# Patient Record
Sex: Female | Born: 2000 | Race: Black or African American | Hispanic: No | Marital: Single | Smoking: Never smoker
Health system: Southern US, Community
[De-identification: ages and names within clinical notes are randomized; demographics above are authoritative.]

## PROBLEM LIST (undated history)

## (undated) DIAGNOSIS — K5900 Constipation, unspecified: Secondary | ICD-10-CM

---
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
## 2022-09-14 ENCOUNTER — Encounter (HOSPITAL_BASED_OUTPATIENT_CLINIC_OR_DEPARTMENT_OTHER): Payer: Self-pay | Admitting: Emergency Medicine

## 2022-09-14 ENCOUNTER — Other Ambulatory Visit: Payer: Self-pay

## 2022-09-14 ENCOUNTER — Emergency Department (HOSPITAL_BASED_OUTPATIENT_CLINIC_OR_DEPARTMENT_OTHER)
Admission: EM | Admit: 2022-09-14 | Discharge: 2022-09-14 | Disposition: A | Discharge status: Home / Self Care | Payer: Medicaid Other | Attending: Emergency Medicine | Admitting: Emergency Medicine

## 2022-09-14 DIAGNOSIS — R1084 Generalized abdominal pain: Secondary | ICD-10-CM | POA: Insufficient documentation

## 2022-09-14 DIAGNOSIS — R110 Nausea: Secondary | ICD-10-CM | POA: Insufficient documentation

## 2022-09-14 DIAGNOSIS — R197 Diarrhea, unspecified: Secondary | ICD-10-CM | POA: Insufficient documentation

## 2022-09-14 DIAGNOSIS — Z20822 Contact with and (suspected) exposure to covid-19: Secondary | ICD-10-CM | POA: Insufficient documentation

## 2022-09-14 LAB — COMPREHENSIVE METABOLIC PANEL
ALT: 11 U/L (ref 0–44)
AST: 24 U/L (ref 15–41)
Albumin: 4.3 g/dL (ref 3.5–5.0)
Alkaline Phosphatase: 68 U/L (ref 38–126)
Anion gap: 11 (ref 5–15)
BUN: 9 mg/dL (ref 6–20)
CO2: 17 mmol/L — ABNORMAL LOW (ref 22–32)
Calcium: 8.7 mg/dL — ABNORMAL LOW (ref 8.9–10.3)
Chloride: 109 mmol/L (ref 98–111)
Creatinine, Ser: 0.76 mg/dL (ref 0.44–1.00)
GFR, Estimated: >60 mL/min (ref >60)
Glucose, Bld: 91 mg/dL (ref 70–99)
Potassium: 3.2 mmol/L — ABNORMAL LOW (ref 3.5–5.1)
Sodium: 137 mmol/L (ref 135–145)
Total Bilirubin: 0.7 mg/dL (ref 0.3–1.2)
Total Protein: 7.8 g/dL (ref 6.5–8.1)

## 2022-09-14 LAB — CBC WITH DIFFERENTIAL/PLATELET
Abs Immature Granulocytes: 0.01 10*3/uL (ref 0.00–0.07)
Basophils Absolute: 0 10*3/uL (ref 0.0–0.1)
Basophils Relative: 1 %
Eosinophils Absolute: 0.1 10*3/uL (ref 0.0–0.5)
Eosinophils Relative: 2 %
HCT: 36.9 % (ref 36.0–46.0)
Hemoglobin: 11.7 g/dL — ABNORMAL LOW (ref 12.0–15.0)
Immature Granulocytes: 0 %
Lymphocytes Relative: 23 %
Lymphs Abs: 0.8 10*3/uL (ref 0.7–4.0)
MCH: 24 pg — ABNORMAL LOW (ref 26.0–34.0)
MCHC: 31.7 g/dL (ref 30.0–36.0)
MCV: 75.6 fL — ABNORMAL LOW (ref 80.0–100.0)
Monocytes Absolute: 0.6 10*3/uL (ref 0.1–1.0)
Monocytes Relative: 18 %
Neutro Abs: 2 10*3/uL (ref 1.7–7.7)
Neutrophils Relative %: 56 %
Platelets: 240 10*3/uL (ref 150–400)
RBC: 4.88 MIL/uL (ref 3.87–5.11)
RDW: 16.3 % — ABNORMAL HIGH (ref 11.5–15.5)
WBC: 3.5 10*3/uL — ABNORMAL LOW (ref 4.0–10.5)
nRBC: 0 % (ref 0.0–0.2)

## 2022-09-14 LAB — RESP PANEL BY RT-PCR (FLU A&B, COVID) ARPGX2
Influenza A by PCR: NEGATIVE
Influenza B by PCR: NEGATIVE
SARS Coronavirus 2 by RT PCR: NEGATIVE

## 2022-09-14 LAB — LIPASE, BLOOD: Lipase: 21 U/L (ref 11–51)

## 2022-09-14 MED ORDER — ONDANSETRON HCL 4 MG/2ML IJ SOLN
4.0000 mg | Freq: Once | INTRAMUSCULAR | Status: AC
  Administered 2022-09-14: 4 mg via INTRAVENOUS
  Filled 2022-09-14: qty 2

## 2022-09-14 MED ORDER — DROPERIDOL 2.5 MG/ML IJ SOLN
1.2500 mg | Freq: Once | INTRAMUSCULAR | Status: AC
  Administered 2022-09-14: 1.25 mg via INTRAVENOUS
  Filled 2022-09-14: qty 2

## 2022-09-14 MED ORDER — DIPHENHYDRAMINE HCL 50 MG/ML IJ SOLN
25.0000 mg | Freq: Once | INTRAMUSCULAR | Status: DC
  Filled 2022-09-14: qty 1

## 2022-09-14 MED ORDER — LACTATED RINGERS IV BOLUS
1000.0000 mL | Freq: Once | INTRAVENOUS | Status: AC
  Administered 2022-09-14: 1000 mL via INTRAVENOUS

## 2022-09-14 NOTE — ED Provider Notes (Signed)
[facility] EMERGENCY DEPARTMENT Provider Note   CSN: [id number] Arrival date & time: 09/14/22  1347     History  Chief Complaint  Patient presents with   Abdominal Pain   Nausea   Headache    Ashley Castro is a 21 y.o. female.  21 year old female presents today for evaluation of abdominal pain ongoing since October 7.  Recently evaluated at another emergency room on October 9 with reassuring work-up.  However she states she did not have imaging at that time and her symptoms persist.  She states she had 1 episode of emesis on October 7 and no additional episodes.  She states she has had some diarrhea since yesterday.  She denies fever, chills.  Does endorse marijuana use most recently this morning.  Denies dysuria, urinary frequency, pelvic pain, flank pain, vaginal discharge, or recent change in sexual partner.  She states she is currently on her menstrual cycle.  The history is provided by the patient and medical records. No language interpreter was used.       Home Medications Prior to Admission medications   Medication Sig Start Date End Date Taking? Authorizing Provider  DENTA 5000 PLUS 1.1 % CREA dental cream BRUSH EVERY OTHER NIGHT AND NOTHING TO EAT OR DRINK FOR 30 MINUTES 04/14/19   [provider]  desogestrel-ethinyl estradiol (APRI) 0.15-30 MG-MCG tablet Take by mouth. 07/23/19   [provider]  hydrocortisone 2.5 % lotion Apply topically 2 (two) times daily. Patient not taking: Reported on 05/07/2019 04/02/18   Aviva Kluver B, PA-C  polyethylene glycol [facility]) packet Take 17 g by mouth daily. Patient not taking: Reported on 08/12/2019 12/20/18   Petrucelli, Pleas Koch, PA-C  SM CLEARLAX powder  12/20/18   [provider]  triamcinolone cream (KENALOG) 0.1 % Apply 1 application topically 3 (three) times daily as needed. Patient not taking: Reported on 08/12/2019 07/26/15   Pricilla Loveless, MD  Vitamin D, Ergocalciferol, (DRISDOL)  1.25 MG (50000 UT) CAPS capsule TAKE 1 CAP BY MOUTH WEEKLY FOR 12 WEEKS 04/22/19   [provider]      Allergies    Patient has no known allergies.    Review of Systems   Review of Systems  Constitutional:  Negative for chills and fever.  Gastrointestinal:  Positive for abdominal pain. Negative for nausea and vomiting.  Genitourinary:  Positive for vaginal bleeding (menstrual bleeding). Negative for dysuria, flank pain, pelvic pain, vaginal discharge and vaginal pain.  Neurological:  Negative for light-headedness.  All other systems reviewed and are negative.   Physical Exam Updated Vital Signs BP 112/89 (BP Location: Left Arm)   Pulse 85   Temp 97.7 F (36.5 C) (Oral)   Resp 18   Ht 5\' 2"  (1.575 m)   Wt 61.2 kg   LMP 09/10/2022 (Exact Date)   SpO2 99%   BMI 24.69 kg/m  Physical Exam Vitals and nursing note reviewed.  Constitutional:      General: She is not in acute distress.    Appearance: Normal appearance. She is well-developed. She is not ill-appearing.  HENT:     Head: Normocephalic and atraumatic.     Nose: Nose normal.  Eyes:     Conjunctiva/sclera: Conjunctivae normal.  Cardiovascular:     Rate and Rhythm: Normal rate and regular rhythm.     Pulses: Normal pulses.  Pulmonary:     Effort: Pulmonary effort is normal. No respiratory distress.     Breath sounds: Normal breath sounds. No wheezing  or rales.  Abdominal:     General: There is no distension.     Palpations: Abdomen is soft.     Tenderness: There is no abdominal tenderness. There is no right CVA tenderness, left CVA tenderness or guarding.  Musculoskeletal:        General: No deformity. Normal range of motion.     Cervical back: Normal range of motion.     Right lower leg: No edema.     Left lower leg: No edema.  Skin:    Findings: No rash.  Neurological:     Mental Status: She is alert.     ED Results / Procedures / Treatments   Labs (all labs ordered are listed, but only  abnormal results are displayed) Labs Reviewed  COMPREHENSIVE METABOLIC PANEL - Abnormal; Notable for the following components:      Result Value   Potassium 3.2 (*)    CO2 17 (*)    Calcium 8.7 (*)    All other components within normal limits  CBC WITH DIFFERENTIAL/PLATELET - Abnormal; Notable for the following components:   WBC 3.5 (*)    Hemoglobin 11.7 (*)    MCV 75.6 (*)    MCH 24.0 (*)    RDW 16.3 (*)    All other components within normal limits  LIPASE, BLOOD  URINALYSIS, ROUTINE W REFLEX MICROSCOPIC  PREGNANCY, URINE    EKG None  Radiology No results found.  Procedures Procedures    Medications Ordered in ED Medications  lactated ringers bolus 1,000 mL (has no administration in time range)  ondansetron (ZOFRAN) injection 4 mg (has no administration in time range)  droperidol (INAPSINE) 2.5 MG/ML injection 1.25 mg (has no administration in time range)    ED Course/ Medical Decision Making/ A&P                           Medical Decision Making Amount and/or Complexity of Data Reviewed Labs: ordered. Radiology: ordered.  Risk Prescription drug management.   Medical Decision Making / ED Course   This patient presents to the ED for concern of abdominal pain, this involves an extensive number of treatment options, and is a complaint that carries with it a high risk of complications and morbidity.  The differential diagnosis includes gastroenteritis, cholecystitis, appendicitis, PID, cannabinoid hyperemesis syndrome, UTI, pyelonephritis  MDM: 21 year old female presents today for evaluation of abdominal pain which she states has been ongoing for years however this particular episode has been ongoing since October 7.  Has been evaluated at outside emergency room with reassuring work-up.  She is concerned because she did not have imaging done at that time and she is worried it may be something concerning.  Low suspicion for PID given no recent change in sexual  partner, without vaginal discharge, or pelvic pain.  Her abdominal pain is generalized.  Low suspicion of UTI pyelonephritis as she is without flank pain, dysuria, urinary frequency.  Will provide symptomatic management, and obtain CT abdomen pelvis.  Will provide IV fluids.  Mild hypokalemia noted on blood work with potassium of 3.2.  CBC otherwise shows mild leukopenia at 3.5, hemoglobin of 11.7 otherwise without acute abnormalities.  Lipase within normal limits.  CMP outside of the mild hypokalemia unremarkable.  Overall reassuring work-up.  Abdomen is benign.  Low suspicion for appendicitis as there is no tenderness palpation, and she is afebrile.  Likely cannabinoid hyperemesis syndrome.  Patient on reevaluation became agitated and requested  to leave.  She reports improvement in her abdominal pain.  Her grandma is on the phone on FaceTime call as well.  Patient is alert and oriented x4.  Question if she became agitated following droperidol.  Offered Benadryl and reassessment patient initially agreeable however then changed her mind and requested to leave.  Patient is able to make this decision.  Patient does not want potassium supplement either.  Patient discharged in stable condition.  Return precautions discussed.  Patient voices understanding and is in agreement with plan.   Additional history obtained: -Additional history obtained from ER visit at [facility] -External records from outside source obtained and reviewed including: Chart review including previous notes, labs, imaging, consultation notes   Lab Tests: -I ordered, reviewed, and interpreted labs.   The pertinent results include:   Labs Reviewed  COMPREHENSIVE METABOLIC PANEL - Abnormal; Notable for the following components:      Result Value   Potassium 3.2 (*)    CO2 17 (*)    Calcium 8.7 (*)    All other components within normal limits  CBC WITH DIFFERENTIAL/PLATELET - Abnormal; Notable for the following components:   WBC 3.5 (*)     Hemoglobin 11.7 (*)    MCV 75.6 (*)    MCH 24.0 (*)    RDW 16.3 (*)    All other components within normal limits  RESP PANEL BY RT-PCR (FLU A&B, COVID) ARPGX2  LIPASE, BLOOD  URINALYSIS, ROUTINE W REFLEX MICROSCOPIC  PREGNANCY, URINE      EKG  EKG Interpretation  Date/Time:    Ventricular Rate:    PR Interval:    QRS Duration:   QT Interval:    QTC Calculation:   R Axis:     Text Interpretation:           Imaging Studies ordered: I ordered imaging studies including CT abdomen pelvis ordered but patient did want this prior to discharge.  I independently visualized and interpreted imaging. I agree with the radiologist interpretation   Medicines ordered and prescription drug management: Meds ordered this encounter  Medications   lactated ringers bolus 1,000 mL   ondansetron (ZOFRAN) injection 4 mg   droperidol (INAPSINE) 2.5 MG/ML injection 1.25 mg    -I have reviewed the patients home medicines and have made adjustments as needed  Reevaluation: After the interventions noted above, I reevaluated the patient and found that they have :improved  Co morbidities that complicate the patient evaluation  Past Medical History:  Diagnosis Date   Constipation       Dispostion: Patient discharged in stable condition. Return precautions discussed.   Final Clinical Impression(s) / ED Diagnoses Final diagnoses:  Generalized abdominal pain    Rx / DC Orders ED Discharge Orders     None         Marita [place], PA-C 09/14/22 1707    Gwyneth Sprout, MD 09/16/22 1520

## 2022-09-14 NOTE — ED Notes (Signed)
Called to room by patient. Reports she is tired, uncomfortable and ready to go home. Provider notified

## 2022-09-14 NOTE — Discharge Instructions (Addendum)
Your work-up today was overall reassuring however he decided to leave prior to having your CT scan done.  Your potassium was slightly low at 3.2 you did not want any medications or supplements prior to discharge.  He reports your abdominal pain was better prior to discharge.  For any concerning symptoms please return to the emergency room.

## 2022-09-14 NOTE — ED Notes (Signed)
Reviewed discharge instructions and recommendations with patient. Pt states understanding. Pt ambulatory for discharge. Pt being picked by grand mother

## 2022-09-14 NOTE — ED Triage Notes (Signed)
Generalized abdominal pain, nausea and intermittent headache that restarted on 10/7. States this has been an ongoing issues x 7 - 8 years. Seen at [facility] on 10/9 for same and told nothing was wrong. Daily marijuana and vape use.

## 2023-06-21 ENCOUNTER — Encounter (HOSPITAL_BASED_OUTPATIENT_CLINIC_OR_DEPARTMENT_OTHER): Payer: Self-pay | Admitting: Emergency Medicine

## 2023-06-21 ENCOUNTER — Emergency Department (HOSPITAL_BASED_OUTPATIENT_CLINIC_OR_DEPARTMENT_OTHER): Payer: Medicaid Other

## 2023-06-21 ENCOUNTER — Observation Stay (HOSPITAL_BASED_OUTPATIENT_CLINIC_OR_DEPARTMENT_OTHER)
Admission: EM | Admit: 2023-06-21 | Discharge: 2023-06-22 | Disposition: A | Discharge status: Home / Self Care | Payer: Medicaid Other | Attending: Internal Medicine | Admitting: Internal Medicine

## 2023-06-21 ENCOUNTER — Other Ambulatory Visit: Payer: Self-pay

## 2023-06-21 DIAGNOSIS — E876 Hypokalemia: Secondary | ICD-10-CM | POA: Insufficient documentation

## 2023-06-21 DIAGNOSIS — D509 Iron deficiency anemia, unspecified: Secondary | ICD-10-CM | POA: Diagnosis not present

## 2023-06-21 DIAGNOSIS — R112 Nausea with vomiting, unspecified: Principal | ICD-10-CM | POA: Insufficient documentation

## 2023-06-21 DIAGNOSIS — E8729 Other acidosis: Secondary | ICD-10-CM | POA: Diagnosis not present

## 2023-06-21 DIAGNOSIS — Z79899 Other long term (current) drug therapy: Secondary | ICD-10-CM | POA: Diagnosis not present

## 2023-06-21 LAB — CBC
HCT: 37.7 % (ref 36.0–46.0)
Hemoglobin: 12 g/dL (ref 12.0–15.0)
MCH: 23.8 pg — ABNORMAL LOW (ref 26.0–34.0)
MCHC: 31.8 g/dL (ref 30.0–36.0)
MCV: 74.7 fL — ABNORMAL LOW (ref 80.0–100.0)
Platelets: 231 10*3/uL (ref 150–400)
RBC: 5.05 MIL/uL (ref 3.87–5.11)
RDW: 16.2 % — ABNORMAL HIGH (ref 11.5–15.5)
WBC: 4.9 10*3/uL (ref 4.0–10.5)
nRBC: 0 % (ref 0.0–0.2)

## 2023-06-21 LAB — RAPID URINE DRUG SCREEN, HOSP PERFORMED
Amphetamines: NOT DETECTED
Barbiturates: NOT DETECTED
Benzodiazepines: NOT DETECTED
Cocaine: NOT DETECTED
Opiates: NOT DETECTED
Tetrahydrocannabinol: POSITIVE — AB

## 2023-06-21 LAB — URINALYSIS, ROUTINE W REFLEX MICROSCOPIC
Bilirubin Urine: NEGATIVE
Glucose, UA: NEGATIVE mg/dL
Ketones, ur: >=80 mg/dL — AB
Leukocytes,Ua: NEGATIVE
Nitrite: NEGATIVE
Protein, ur: NEGATIVE mg/dL
Specific Gravity, Urine: 1.02 (ref 1.005–1.030)
pH: 8.5 — ABNORMAL HIGH (ref 5.0–8.0)

## 2023-06-21 LAB — MAGNESIUM: Magnesium: 1.9 mg/dL (ref 1.7–2.4)

## 2023-06-21 LAB — URINALYSIS, MICROSCOPIC (REFLEX): Squamous Epithelial / HPF: NONE SEEN /HPF (ref 0–5)

## 2023-06-21 LAB — COMPREHENSIVE METABOLIC PANEL
ALT: 13 U/L (ref 0–44)
AST: 24 U/L (ref 15–41)
Albumin: 4.5 g/dL (ref 3.5–5.0)
Alkaline Phosphatase: 64 U/L (ref 38–126)
Anion gap: 12 (ref 5–15)
BUN: 12 mg/dL (ref 6–20)
CO2: 18 mmol/L — ABNORMAL LOW (ref 22–32)
Calcium: 8.9 mg/dL (ref 8.9–10.3)
Chloride: 106 mmol/L (ref 98–111)
Creatinine, Ser: 0.83 mg/dL (ref 0.44–1.00)
GFR, Estimated: >60 mL/min (ref >60)
Glucose, Bld: 117 mg/dL — ABNORMAL HIGH (ref 70–99)
Potassium: 2.6 mmol/L — CL (ref 3.5–5.1)
Sodium: 136 mmol/L (ref 135–145)
Total Bilirubin: 1.1 mg/dL (ref 0.3–1.2)
Total Protein: 7.6 g/dL (ref 6.5–8.1)

## 2023-06-21 LAB — TROPONIN I (HIGH SENSITIVITY): Troponin I (High Sensitivity): <2 ng/L (ref <18)

## 2023-06-21 LAB — BRAIN NATRIURETIC PEPTIDE: B Natriuretic Peptide: 5.7 pg/mL (ref 0.0–100.0)

## 2023-06-21 LAB — PREGNANCY, URINE: Preg Test, Ur: NEGATIVE

## 2023-06-21 LAB — HIV ANTIBODY (ROUTINE TESTING W REFLEX): HIV Screen 4th Generation wRfx: NONREACTIVE

## 2023-06-21 LAB — D-DIMER, QUANTITATIVE: D-Dimer, Quant: 0.27 ug/mL-FEU (ref 0.00–0.50)

## 2023-06-21 LAB — POTASSIUM: Potassium: 3.3 mmol/L — ABNORMAL LOW (ref 3.5–5.1)

## 2023-06-21 LAB — CREATININE, SERUM
Creatinine, Ser: 0.59 mg/dL (ref 0.44–1.00)
GFR, Estimated: >60 mL/min (ref >60)

## 2023-06-21 LAB — LIPASE, BLOOD: Lipase: 26 U/L (ref 11–51)

## 2023-06-21 MED ORDER — POTASSIUM CHLORIDE 10 MEQ/100ML IV SOLN: 10.0000 meq | Freq: Once | INTRAVENOUS | Status: DC

## 2023-06-21 MED ORDER — ALUM & MAG HYDROXIDE-SIMETH 200-200-20 MG/5ML PO SUSP: 30.0000 mL | ORAL | Status: DC | PRN

## 2023-06-21 MED ORDER — LACTATED RINGERS IV BOLUS
1000.0000 mL | Freq: Once | INTRAVENOUS | Status: AC
  Administered 2023-06-21: 1000 mL via INTRAVENOUS

## 2023-06-21 MED ORDER — POTASSIUM CHLORIDE IN NACL 40-0.9 MEQ/L-% IV SOLN
INTRAVENOUS | Status: DC
  Filled 2023-06-21 (×2): qty 1000

## 2023-06-21 MED ORDER — SODIUM CHLORIDE 0.9 % IV SOLN: Freq: Once | INTRAVENOUS | Status: AC

## 2023-06-21 MED ORDER — HALOPERIDOL LACTATE 5 MG/ML IJ SOLN
3.0000 mg | Freq: Once | INTRAMUSCULAR | Status: AC
  Administered 2023-06-21: 3 mg via INTRAVENOUS
  Filled 2023-06-21: qty 1

## 2023-06-21 MED ORDER — PANTOPRAZOLE SODIUM 40 MG IV SOLR
INTRAVENOUS | Status: AC
  Administered 2023-06-21: 40 mg
  Filled 2023-06-21: qty 20

## 2023-06-21 MED ORDER — POTASSIUM CHLORIDE 10 MEQ/100ML IV SOLN
10.0000 meq | INTRAVENOUS | Status: AC
  Administered 2023-06-21 (×3): 10 meq via INTRAVENOUS
  Filled 2023-06-21: qty 100

## 2023-06-21 MED ORDER — ACETAMINOPHEN 325 MG PO TABS: 650.0000 mg | ORAL_TABLET | Freq: Four times a day (QID) | ORAL | Status: DC | PRN

## 2023-06-21 MED ORDER — PANTOPRAZOLE SODIUM 40 MG IV SOLR
40.0000 mg | Freq: Two times a day (BID) | INTRAVENOUS | Status: DC
  Administered 2023-06-21 – 2023-06-22 (×2): 40 mg via INTRAVENOUS
  Filled 2023-06-21 (×2): qty 10

## 2023-06-21 MED ORDER — ONDANSETRON HCL 4 MG/2ML IJ SOLN
4.0000 mg | Freq: Once | INTRAMUSCULAR | Status: AC | PRN
  Administered 2023-06-21: 4 mg via INTRAVENOUS
  Filled 2023-06-21: qty 2

## 2023-06-21 MED ORDER — ALUM & MAG HYDROXIDE-SIMETH 200-200-20 MG/5ML PO SUSP
30.0000 mL | Freq: Once | ORAL | Status: AC
  Administered 2023-06-21: 30 mL via ORAL
  Filled 2023-06-21: qty 30

## 2023-06-21 MED ORDER — ENOXAPARIN SODIUM 40 MG/0.4ML IJ SOSY
40.0000 mg | PREFILLED_SYRINGE | INTRAMUSCULAR | Status: DC
  Administered 2023-06-21: 40 mg via SUBCUTANEOUS
  Filled 2023-06-21: qty 0.4

## 2023-06-21 MED ORDER — ONDANSETRON HCL 4 MG PO TABS: 4.0000 mg | ORAL_TABLET | Freq: Three times a day (TID) | ORAL | 0 refills | Status: DC | PRN

## 2023-06-21 MED ORDER — PANTOPRAZOLE SODIUM 20 MG PO TBEC: 20.0000 mg | DELAYED_RELEASE_TABLET | Freq: Two times a day (BID) | ORAL | 0 refills | Status: DC

## 2023-06-21 MED ORDER — PANTOPRAZOLE 80MG IVPB - SIMPLE MED
80.0000 mg | Freq: Once | INTRAVENOUS | Status: AC
  Administered 2023-06-21: 80 mg via INTRAVENOUS
  Filled 2023-06-21: qty 100

## 2023-06-21 MED ORDER — POTASSIUM CHLORIDE CRYS ER 20 MEQ PO TBCR: 40.0000 meq | EXTENDED_RELEASE_TABLET | Freq: Once | ORAL | 0 refills | Status: DC

## 2023-06-21 MED ORDER — METOCLOPRAMIDE HCL 5 MG/ML IJ SOLN: 10.0000 mg | Freq: Four times a day (QID) | INTRAMUSCULAR | Status: DC | PRN

## 2023-06-21 MED ORDER — ENOXAPARIN SODIUM 40 MG/0.4ML IJ SOSY: 40.0000 mg | PREFILLED_SYRINGE | INTRAMUSCULAR | Status: DC

## 2023-06-21 MED ORDER — POTASSIUM CHLORIDE 10 MEQ/100ML IV SOLN
10.0000 meq | Freq: Once | INTRAVENOUS | Status: AC
  Administered 2023-06-21: 10 meq via INTRAVENOUS
  Filled 2023-06-21: qty 100

## 2023-06-21 MED ORDER — ACETAMINOPHEN 650 MG RE SUPP: 650.0000 mg | Freq: Four times a day (QID) | RECTAL | Status: DC | PRN

## 2023-06-21 MED ORDER — ONDANSETRON HCL 4 MG/2ML IJ SOLN
4.0000 mg | Freq: Four times a day (QID) | INTRAMUSCULAR | Status: DC
  Administered 2023-06-21 – 2023-06-22 (×2): 4 mg via INTRAVENOUS
  Filled 2023-06-21 (×2): qty 2

## 2023-06-21 MED ORDER — MAGNESIUM SULFATE IN D5W 1-5 GM/100ML-% IV SOLN
1.0000 g | Freq: Once | INTRAVENOUS | Status: AC
  Administered 2023-06-21: 1 g via INTRAVENOUS
  Filled 2023-06-21: qty 100

## 2023-06-21 NOTE — Discharge Instructions (Addendum)
For your pain, you can try topical capsaicin cream with a concentration of 0.025 to 0.1% in a thin film over the abdomen.   We recommend cannabis cessation as marijuana is associated with episodes of abdominal pain and vomiting.

## 2023-06-21 NOTE — ED Notes (Signed)
Spoke to [facility] for hospitalist consult .

## 2023-06-21 NOTE — ED Triage Notes (Addendum)
Emesis x 3 days , abd pain and shortness of breath , lethargic . Diarrhea .  Marijuana use daily , she said

## 2023-06-21 NOTE — ED Provider Notes (Signed)
[facility] EMERG[facility]Y DEPART[facility] AT [facility] Provider Note   CSN: [id number] Arrival date & time: 06/21/23  0725     History Chief Complaint  Patient presents with   Emesis     Emesis 2 days of worsening vomiting, Ashley Castro is a 22 y.o. female presenting 2-day history of chest pain.    She describes the pain as dull in nature, intermittent and associated with nausea and vomiting.  She denies fever.  She is having chills and shortness of breath.  She does not smoke cigarettes or drink alcohol. Reports regular marijuana use.  Of note patient was admitted on 07/21?2024 for vomiting and abdominal pain. No organic etiology was found.   Patient's recorded medical, surgical, social, medication list and allergies were reviewed in the Snapshot window as part of the initial history.   Review of Systems   Review of Systems  Gastrointestinal:  Positive for vomiting.    Physical Exam Updated Vital Signs BP (!) 149/95 (BP Location: Right Arm)   Pulse 78   Temp 97.8 F (36.6 C) (Oral)   Resp 19   Wt 54.9 kg   LMP 06/19/2023 (Exact Date)   SpO2 100%   BMI 22.13 kg/m  Physical Exam General: Pleasant, ill-appearing.   CV: RRR. No murmurs, rubs, or gallops. No LE edema Pulmonary: Lungs CTAB. Normal effort. No wheezing or rales. Abdominal: Soft, nontender, nondistended. Normal bowel sounds. Extremities: Palpable pulses. Normal ROM. Skin: Warm and dry. No obvious rash or lesions.   ED Course/ Medical Decision Making/ A&P    Procedures Procedures   Medications Ordered in ED Medications  potassium chloride 10 mEq in 100 mL IVPB (10 mEq Intravenous New Bag/Given 06/21/23 1338)  ondansetron (ZOFRAN) injection 4 mg (4 mg Intravenous Given 06/21/23 0738)  potassium chloride 10 mEq in 100 mL IVPB (0 mEq Intravenous Stopped 06/21/23 1319)  0.9 %  sodium chloride infusion ( Intravenous New Bag/Given 06/21/23 0832)  pantoprazole (PROTONIX) 80 mg /NS 100 mL IVPB (0  mg Intravenous Stopped 06/21/23 0939)  alum & mag hydroxide-simeth (MAALOX/MYLANTA) 200-200-20 MG/5ML suspension 30 mL (30 mLs Oral Given 06/21/23 0857)  lactated ringers bolus 1,000 mL (0 mLs Intravenous Stopped 06/21/23 1002)  pantoprazole (PROTONIX) 40 MG injection (40 mg  Given 06/21/23 0857)  haloperidol lactate (HALDOL) injection 3 mg (3 mg Intravenous Given 06/21/23 1135)  magnesium sulfate IVPB 1 g 100 mL (0 g Intravenous Stopped 06/21/23 1319)    Medical Decision Making:    Ashley Castro is a 22 y.o. female who presented to the ED today with emesis and mid chest pain.  Detailed above.     Patient placed on continuous vitals and telemetry monitoring while in ED which was reviewed periodically.   Complete initial physical exam performed, notably the patient  was stable.  Abdomen with normal bowel sounds.  Heart without murmurs or gallops.   Reviewed and confirmed nursing documentation for past medical history, family history, social history.    Initial Assessment:   Differential diagnosis of chest pain and emesis includes ACS versus gastroparesis versus cyclical vomiting.  Initial Plan:  IV fluids, Zofran Pregnancy test, UA Screening labs including CBC and Metabolic panel to evaluate for infectious or metabolic etiology of disease.  Urinalysis with reflex culture ordered to evaluate for UTI or relevant urologic/nephrologic pathology.  CXR to evaluate for structural/infectious intrathoracic pathology.  EKG to evaluate for cardiac pathology. Objective evaluation as below reviewed with plan for close reassessment  Initial Study Results:  Laboratory  All laboratory results reviewed without evidence of clinically relevant pathology.   Exceptions include: Potassium 2.6  EKG EKG was reviewed independently. Rate, rhythm, axis, intervals all examined and without medically relevant abnormality. ST segments without concerns for elevations.    Radiology  All images reviewed  independently. Agree with radiology report at this time.   DG Chest 2 View  Result Date: 06/21/2023 CLINICAL DATA:  sob EXAM: CHEST - 2 VIEW COMPARISON:  06/07/2020. FINDINGS: Bilateral lung fields are clear. Bilateral costophrenic angles are clear. Normal cardio-mediastinal silhouette. No acute osseous abnormalities. The soft tissues are within normal limits. IMPRESSION: No active cardiopulmonary disease. Electronically Signed   By: Jules Schick M.D.   On: 06/21/2023 07:56      Reassessment and Plan:   Potassium 2.6.  Other lab work unremarkable including negative troponin. Chest x-ray negative for cardiopulmonary disease.  She had CT abdomen and pelvis done on 06/17/2023 which showed no acute abnormalities.   Patient etiologies of vomiting in this patient includes cyclic vomiting vs. cannabinoid hyperemesis syndrome.  Clinical Impression:  1. Nausea and vomiting, unspecified vomiting type      Discharge   Final Clinical Impression(s) / ED Diagnoses Final diagnoses:  Nausea and vomiting, unspecified vomiting type    Rx / DC Orders ED Discharge Orders          Ordered    potassium chloride SA (KLOR-CON M) 20 MEQ tablet   Once        06/21/23 1344              Laretta Bolster, MD 06/21/23 1351    Alvira Monday, MD 06/21/23 2222

## 2023-06-21 NOTE — H&P (Signed)
History and Physical    Ashley Castro ZOX:[id number] DOB: [birth year] DOA: 06/21/2023  PCP: Patient, No Pcp Per   Patient coming from: Home  I have personally briefly reviewed patient's old medical records in [facility]  Chief Complaint: Vomiting  HPI: Ashley Castro is a 22 y.o. female with medical history significant of the past medical history presented with worsening nausea, vomiting and upper abdominal pain, progressively getting worse over the last few days.  Patient was recently placed in observation on 06/17/2023 at [facility] for similar complaints and was discharged after symptoms improved with IV fluids and conservative management.  CT of abdomen and pelvis on 06/17/2023 showed no acute abnormalities.  Patient denies any fever, shortness of breath, diarrhea, dysuria, increased frequency of urination, loss of consciousness, seizures.  Patient reports regular marijuana use.  ED Course: Potassium was 2.6.  She was given IV fluids, antiemetics, IV potassium and IV Protonix.  Patient continued to be symptomatic with hospitalist service was called to evaluate the patient.  Review of Systems: As per HPI otherwise all other systems were reviewed and are negative.   Past Medical History:  Diagnosis Date   Constipation     No past surgical history reported.   reports that she has never smoked. She has been exposed to tobacco smoke. She has never used smokeless tobacco. She reports current alcohol use. She reports current drug use. Drug: Marijuana.  No Known Allergies  Family History  Problem Relation Age of Onset   Asthma Sister     Prior to Admission medications   Medication Sig Start Date End Date Taking? Authorizing Provider  ondansetron (ZOFRAN) 4 MG tablet Take 1 tablet (4 mg total) by mouth every 8 (eight) hours as needed for nausea or vomiting. 06/21/23 07/21/23 Yes Koomson, Lisbeth Ply, MD  pantoprazole (PROTONIX) 20 MG tablet Take 1 tablet (20 mg total) by mouth  2 (two) times daily. 06/21/23  Yes Laretta Bolster, MD  potassium chloride SA (KLOR-CON M) 20 MEQ tablet Take 2 tablets (40 mEq total) by mouth once for 1 dose. 06/21/23 06/21/23 Yes Koomson, Lisbeth Ply, MD  DENTA 5000 PLUS 1.1 % CREA dental cream BRUSH EVERY OTHER NIGHT AND NOTHING TO EAT OR DRINK FOR 30 MINUTES 04/14/19   [provider]  desogestrel-ethinyl estradiol (APRI) 0.15-30 MG-MCG tablet Take by mouth. 07/23/19   [provider]  hydrocortisone 2.5 % lotion Apply topically 2 (two) times daily. Patient not taking: Reported on 05/07/2019 04/02/18   Aviva Kluver B, PA-C  polyethylene glycol [facility]) packet Take 17 g by mouth daily. Patient not taking: Reported on 08/12/2019 12/20/18   Petrucelli, Pleas Koch, PA-C  SM CLEARLAX powder  12/20/18   [provider]  triamcinolone cream (KENALOG) 0.1 % Apply 1 application topically 3 (three) times daily as needed. Patient not taking: Reported on 08/12/2019 07/26/15   Pricilla Loveless, MD  Vitamin D, Ergocalciferol, (DRISDOL) 1.25 MG (50000 UT) CAPS capsule TAKE 1 CAP BY MOUTH WEEKLY FOR 12 WEEKS 04/22/19   [provider]    Physical Exam: Vitals:   06/21/23 1523 06/21/23 1530 06/21/23 1713 06/21/23 1732  BP:  (!) 101/54 122/82 122/82  Pulse:  (!) 56 (!) 54 (!) 54  Resp:  16 14 14   Temp: 99.9 F (37.7 C)  98.6 F (37 C) 98.6 F (37 C)  TempSrc: Oral   Oral  SpO2:  96% 100%   Weight:    56.4 kg  Height:    5\' 1"  (1.549 m)  Constitutional: NAD, calm, comfortable.  On room air. Vitals:   06/21/23 1523 06/21/23 1530 06/21/23 1713 06/21/23 1732  BP:  (!) 101/54 122/82 122/82  Pulse:  (!) 56 (!) 54 (!) 54  Resp:  16 14 14   Temp: 99.9 F (37.7 C)  98.6 F (37 C) 98.6 F (37 C)  TempSrc: Oral   Oral  SpO2:  96% 100%   Weight:    56.4 kg  Height:    5\' 1"  (1.549 m)   Eyes: PERRL, lids and conjunctivae normal ENMT: Mucous membranes are dry.  Posterior pharynx clear of any exudate or lesions. Neck: normal,  supple, no masses, no thyromegaly Respiratory: bilateral decreased breath sounds at bases, no wheezing, no crackles. Normal respiratory effort. No accessory muscle use.  Cardiovascular: S1 S2 positive, mild intermittent bradycardia present.  No extremity edema. 2+ pedal pulses.  Abdomen: no tenderness, no masses palpated. No hepatosplenomegaly. Bowel sounds positive.  Musculoskeletal: no clubbing / cyanosis. No joint deformity upper and lower extremities.  Skin: no rashes, lesions, ulcers. No induration Neurologic: Slow to respond, poor historian.  CN 2-12 grossly intact. Moving extremities. No focal neurologic deficits.  Psychiatric: Flat affect.  Not agitated.   Labs on Admission: I have personally reviewed following labs and imaging studies  CBC: Recent Labs  Lab 06/21/23 0736  WBC 4.9  HGB 12.0  HCT 37.7  MCV 74.7*  PLT 231   Basic Metabolic Panel: Recent Labs  Lab 06/21/23 0736 06/21/23 1546  NA 136  --   K 2.6*  --   CL 106  --   CO2 18*  --   GLUCOSE 117*  --   BUN 12  --   CREATININE 0.83 0.59  CALCIUM 8.9  --   MG 1.9  --    GFR: Estimated Creatinine Clearance: 84.6 mL/min (by C-G formula based on SCr of 0.59 mg/dL). Liver Function Tests: Recent Labs  Lab 06/21/23 0736  AST 24  ALT 13  ALKPHOS 64  BILITOT 1.1  PROT 7.6  ALBUMIN 4.5   Recent Labs  Lab 06/21/23 0736  LIPASE 26   No results for input(s): "AMMONIA" in the last 168 hours. Coagulation Profile: No results for input(s): "INR", "PROTIME" in the last 168 hours. Cardiac Enzymes: No results for input(s): "CKTOTAL", "CKMB", "CKMBINDEX", "TROPONINI" in the last 168 hours. BNP (last 3 results) No results for input(s): "PROBNP" in the last 8760 hours. HbA1C: No results for input(s): "HGBA1C" in the last 72 hours. CBG: No results for input(s): "GLUCAP" in the last 168 hours. Lipid Profile: No results for input(s): "CHOL", "HDL", "LDLCALC", "TRIG", "CHOLHDL", "LDLDIRECT" in the last 72  hours. Thyroid Function Tests: No results for input(s): "TSH", "T4TOTAL", "FREET4", "T3FREE", "THYROIDAB" in the last 72 hours. Anemia Panel: No results for input(s): "VITAMINB12", "FOLATE", "FERRITIN", "TIBC", "IRON", "RETICCTPCT" in the last 72 hours. Urine analysis:    Component Value Date/Time   COLORURINE AMBER (A) 06/21/2023 1011   APPEARANCEUR CLEAR 06/21/2023 1011   LABSPEC 1.020 06/21/2023 1011   PHURINE 8.5 (H) 06/21/2023 1011   GLUCOSEU NEGATIVE 06/21/2023 1011   HGBUR MODERATE (A) 06/21/2023 1011   BILIRUBINUR NEGATIVE 06/21/2023 1011   KETONESUR >=80 (A) 06/21/2023 1011   PROTEINUR NEGATIVE 06/21/2023 1011   NITRITE NEGATIVE 06/21/2023 1011   LEUKOCYTESUR NEGATIVE 06/21/2023 1011    Radiological Exams on Admission: DG Chest 2 View  Result Date: 06/21/2023 CLINICAL DATA:  sob EXAM: CHEST - 2 VIEW COMPARISON:  06/07/2020. FINDINGS: Bilateral lung fields are  clear. Bilateral costophrenic angles are clear. Normal cardio-mediastinal silhouette. No acute osseous abnormalities. The soft tissues are within normal limits. IMPRESSION: No active cardiopulmonary disease. Electronically Signed   By: Jules Schick M.D.   On: 06/21/2023 07:56     Assessment/Plan  Intractable nausea and vomiting -Possibly from gastritis/duodenitis versus cyclical vomiting syndrome from cannabinoid hyperemesis syndrome -Check urine drug screen.  Counseled regarding abstinence from marijuana -IV fluids; IV antiemetics around-the-clock for today along with as needed antiemetics.  IV Protonix and as needed Maalox. -Can try clear liquid diet if patient tolerates  Acute metabolic acidosis -Possibly from above.  Repeat a.m. labs.  IV fluids.  Hypokalemia -Possibly from above. -Patient had replacement in the ED.  Repeat a.m. labs.  Check a.m. magnesium as well.  History of marijuana use -Discussion as above.  DVT prophylaxis: Lovenox Code Status: Full Family Communication: None at  bedside Disposition Plan: Home in 1 to 2 days once clinically improved Consults called: None Admission status: Observation/MedSurg  Severity of Illness: The appropriate patient status for this patient is OBSERVATION. Observation status is judged to be reasonable and necessary in order to provide the required intensity of service to ensure the patient's safety. The patient's presenting symptoms, physical exam findings, and initial radiographic and laboratory data in the context of their medical condition is felt to place them at decreased risk for further clinical deterioration. Furthermore, it is anticipated that the patient will be medically stable for discharge from the hospital within 2 midnights of admission.     Glade Lloyd MD [facility]  06/21/2023, 5:45 PM

## 2023-06-22 DIAGNOSIS — R112 Nausea with vomiting, unspecified: Secondary | ICD-10-CM | POA: Diagnosis not present

## 2023-06-22 LAB — COMPREHENSIVE METABOLIC PANEL: Total Protein: 5.9 g/dL — ABNORMAL LOW (ref 6.5–8.1)

## 2023-06-22 MED ORDER — ONDANSETRON HCL 4 MG PO TABS: 4.0000 mg | ORAL_TABLET | Freq: Three times a day (TID) | ORAL | 0 refills | Status: AC | PRN

## 2023-06-22 MED ORDER — PANTOPRAZOLE SODIUM 40 MG PO TBEC: 40.0000 mg | DELAYED_RELEASE_TABLET | Freq: Two times a day (BID) | ORAL | 0 refills | Status: AC

## 2023-06-22 NOTE — TOC Transition Note (Signed)
Transition of Care [facility]) - CM/SW Discharge Note   Patient Details  Name: Ashley Castro MRN: [id number] Date of Birth: 03/09/2001  Transition of Care [facility] CM/SW Contact:  Otelia Santee, LCSW Phone Number: 06/22/2023, 10:20 AM   Clinical Narrative:    Pt agreeable to having SA resources added to DC instructions. Pt also confirmed she has no insurance and no primary care provider. Pt agreeable to having PCP appointment scheduled. Appt scheduled for 8/6. Information placed on follow up.    Final next level of care: Home/Self Care Barriers to Discharge: No Barriers Identified   Patient Goals and CMS Choice CMS Medicare.gov Compare Post Acute Care list provided to:: Patient Choice offered to / list presented to : Patient  Discharge Placement                         Discharge Plan and Services Additional resources added to the After Visit Summary for                  DME Arranged: N/A DME Agency: NA                  Social Determinants of Health (SDOH) Interventions SDOH Screenings   Food Insecurity: No Food Insecurity (06/22/2023)  Housing: Low Risk  (06/22/2023)  Transportation Needs: No Transportation Needs (06/22/2023)  Utilities: Not At Risk (06/21/2023)  Depression (PHQ2-9): Low Risk  (05/15/2019)  Tobacco Use: Medium Risk (06/21/2023)     Readmission Risk Interventions     No data to display

## 2023-06-22 NOTE — Plan of Care (Signed)
  Problem: Education: Goal: Knowledge of General Education information will improve Description: Including pain rating scale, medication(s)/side effects and non-pharmacologic comfort measures Outcome: Progressing   Problem: Health Behavior/Discharge Planning: Goal: Ability to manage health-related needs will improve Outcome: Progressing   Problem: Clinical Measurements: Goal: Ability to maintain clinical measurements within normal limits will improve Outcome: Progressing Goal: Will remain free from infection Outcome: Progressing   Problem: Activity: Goal: Risk for activity intolerance will decrease Outcome: Progressing   Problem: Nutrition: Goal: Adequate nutrition will be maintained Outcome: Progressing   Problem: Safety: Goal: Ability to remain free from injury will improve Outcome: Progressing   Problem: Skin Integrity: Goal: Risk for impaired skin integrity will decrease Outcome: Progressing

## 2023-06-22 NOTE — Discharge Summary (Signed)
Physician Discharge Summary  Dallana Schmude VHQ:[id number] DOB: 05-May-2001 DOA: 06/21/2023  PCP: Patient, No Pcp Per  Admit date: 06/21/2023 Discharge date: 06/22/2023  Admitted From: Home Disposition: Home  Recommendations for Outpatient Follow-up:  Follow up with PCP in 1 week  Outpatient follow-up with GI if symptoms persist Counseled regarding abstinence from marijuana Follow up in ED if symptoms worsen or new appear   Home Health: No Equipment/Devices: None  Discharge Condition: Stable CODE STATUS: Full Diet recommendation: Soft diet 22 y.o. female with no significant past medical history except for regular marijuana use presented with worsening nausea, vomiting and upper abdominal pain, progressively getting worse over the last few days.  On presentation, potassium was 2.6. She was given IV fluids, antiemetics, IV potassium and IV Protonix.  She continued to remain symptomatic hence was transferred to [facility].  During the hospitalization, her condition has improved.  She was treated with IV fluids and antiemetics.  Diet has been advanced and she is tolerating it.  She wants to go home today.  She was counseled regarding abstinence from marijuana.  She will be discharged home today on oral Protonix along with as needed Zofran.  Brief/Interim Summary:  Intractable nausea and vomiting -Possibly from gastritis/duodenitis versus cyclical vomiting syndrome from cannabinoid hyperemesis syndrome -Urine drug screen positive for tetrahydrocannabinol.  Counseled regarding abstinence from marijuana -Treated with IV fluids, IV antiemetics/Protonix. -During the hospitalization, her condition has improved -Diet has been advanced and she is tolerating it.  She wants to go home today.  She was counseled regarding abstinence from marijuana.  She will be discharged home today on oral Protonix along with as needed Zofran. -Recommend outpatient evaluation and follow-up with GI if  condition does not improve   Acute metabolic acidosis -Possibly from above.  Improving.  Outpatient follow-up.   Hypokalemia -Possibly from above. -Replaced during the hospitalization.  Resolved.  Microcytic anemia -Hemoglobin stable.  Outpatient follow-up.  History of marijuana use -Discussion as above.  Discharge Diagnoses:  Principal Problem:   Intractable nausea and vomiting    Discharge Instructions  Discharge Instructions     Ambulatory referral to Gastroenterology   Complete by: As directed    What is the reason for referral?: Other Comment - emesis   Increase activity slowly   Complete by: As directed       Allergies as of 06/22/2023   No Known Allergies      Medication List     STOP taking these medications    hydrocortisone 2.5 % lotion   polyethylene glycol 17 g packet Commonly known as: MiraLax   SM ClearLax 17 GM/SCOOP powder Generic drug: polyethylene glycol powder   triamcinolone cream 0.1 % Commonly known as: KENALOG       TAKE these medications    Denta 5000 Plus 1.1 % Crea dental cream Generic drug: sodium fluoride BRUSH EVERY OTHER NIGHT AND NOTHING TO EAT OR DRINK FOR 30 MINUTES   desogestrel-ethinyl estradiol 0.15-30 MG-MCG tablet Commonly known as: APRI Take by mouth.   ondansetron 4 MG tablet Commonly known as: ZOFRAN Take 1 tablet (4 mg total) by mouth every 8 (eight) hours as needed for nausea or vomiting.   pantoprazole 40 MG tablet Commonly known as: Protonix Take 1 tablet (40 mg total) by mouth 2 (two) times daily before a meal for 15 days.   Vitamin D (Ergocalciferol) 1.25 MG (50000 UNIT) Caps capsule Commonly known as: DRISDOL TAKE 1 CAP BY MOUTH WEEKLY FOR 12 WEEKS  Follow-up Information     Your primary care physician. Schedule an appointment as soon as possible for a visit .          Willis Modena, MD .   Specialty: Gastroenterology Contact information: [phone] [address] [place] [address] [phone]         [facility] Emergency Department at [facility] .   Specialty: Emergency Medicine Why: If symptoms worsen Contact information: [address] [place] [address] [phone]               No Known Allergies  Consultations: None   Procedures/Studies: DG Chest 2 View  Result Date: 06/21/2023 CLINICAL DATA:  sob EXAM: CHEST - 2 VIEW COMPARISON:  06/07/2020. FINDINGS: Bilateral lung fields are clear. Bilateral costophrenic angles are clear. Normal cardio-mediastinal silhouette. No acute osseous abnormalities. The soft tissues are within normal limits. IMPRESSION: No active cardiopulmonary disease. Electronically Signed   By: Jules Schick M.D.   On: 06/21/2023 07:56      Subjective: Patient seen and examined at bedside.  Feels much better.  Tolerating soft diet today.  Wants to go home today.  Denies worsening abdominal pain.  Discharge Exam: Vitals:   06/22/23 0111 06/22/23 0516  BP: 117/80 (!) 122/93  Pulse: (!) 53 62  Resp: 18 17  Temp: 97.9 F (36.6 C) 97.7 F (36.5 C)  SpO2: 100% 100%    General: Pt is alert, awake, not in acute distress.  On room air. Cardiovascular: rate controlled, S1/S2 + Respiratory: bilateral decreased breath sounds at bases Abdominal: Soft, NT, ND, bowel sounds + Extremities: no edema, no cyanosis    The results of significant diagnostics from this hospitalization (including imaging, microbiology, ancillary and laboratory) are listed below for reference.     Microbiology: No results found for this or any previous visit (from the past 240 hour(s)).   Labs: BNP (last 3 results) Recent Labs    06/21/23 0736  BNP 5.7   Basic Metabolic Panel: Recent Labs  Lab 06/21/23 0736 06/21/23 1546 06/21/23 1839 06/22/23 0518  NA 136  --   --  137  K 2.6*  --  3.3* 4.0  CL 106  --   --  110  CO2 18*  --   --  21*  GLUCOSE 117*  --   --  90  BUN 12   --   --  <5*  CREATININE 0.83 0.59  --  0.74  CALCIUM 8.9  --   --  8.4*  MG 1.9  --   --  2.3   Liver Function Tests: Recent Labs  Lab 06/21/23 0736 06/22/23 0518  AST 24 13*  ALT 13 11  ALKPHOS 64 48  BILITOT 1.1 0.8  PROT 7.6 5.9*  ALBUMIN 4.5 3.2*   Recent Labs  Lab 06/21/23 0736  LIPASE 26   No results for input(s): "AMMONIA" in the last 168 hours. CBC: Recent Labs  Lab 06/21/23 0736 06/22/23 0518  WBC 4.9 5.2  HGB 12.0 9.7*  HCT 37.7 31.7*  MCV 74.7* 79.1*  PLT 231 178   Cardiac Enzymes: No results for input(s): "CKTOTAL", "CKMB", "CKMBINDEX", "TROPONINI" in the last 168 hours. BNP: Invalid input(s): "POCBNP" CBG: No results for input(s): "GLUCAP" in the last 168 hours. D-Dimer Recent Labs    06/21/23 0736  DDIMER 0.27   Hgb A1c No results for input(s): "HGBA1C" in the last 72 hours. Lipid Profile No results for input(s): "  CHOL", "HDL", "LDLCALC", "TRIG", "CHOLHDL", "LDLDIRECT" in the last 72 hours. Thyroid function studies No results for input(s): "TSH", "T4TOTAL", "T3FREE", "THYROIDAB" in the last 72 hours.  Invalid input(s): "FREET3" Anemia work up No results for input(s): "VITAMINB12", "FOLATE", "FERRITIN", "TIBC", "IRON", "RETICCTPCT" in the last 72 hours. Urinalysis    Component Value Date/Time   COLORURINE AMBER (A) 06/21/2023 1011   APPEARANCEUR CLEAR 06/21/2023 1011   LABSPEC 1.020 06/21/2023 1011   PHURINE 8.5 (H) 06/21/2023 1011   GLUCOSEU NEGATIVE 06/21/2023 1011   HGBUR MODERATE (A) 06/21/2023 1011   BILIRUBINUR NEGATIVE 06/21/2023 1011   KETONESUR >=80 (A) 06/21/2023 1011   PROTEINUR NEGATIVE 06/21/2023 1011   NITRITE NEGATIVE 06/21/2023 1011   LEUKOCYTESUR NEGATIVE 06/21/2023 1011   Sepsis Labs Recent Labs  Lab 06/21/23 0736 06/22/23 0518  WBC 4.9 5.2   Microbiology No results found for this or any previous visit (from the past 240 hour(s)).   Time coordinating discharge: 35 minutes  SIGNED:   Glade Lloyd,  MD  [facility] 06/22/2023, 9:49 AM

## 2023-07-03 ENCOUNTER — Telehealth: Payer: Self-pay | Admitting: Physician Assistant

## 2023-07-03 ENCOUNTER — Ambulatory Visit: Payer: Medicaid Other | Admitting: Physician Assistant

## 2023-07-03 NOTE — Progress Notes (Deleted)
New patient visit   Patient: Ashley Castro   DOB: 08/28/2001   21 y.o. Female  MRN: [id number] Visit Date: 07/03/2023  Today's healthcare provider: Alfredia Ferguson, PA-C   Cc. N/v, hospital f/u  Subjective    Ashley Castro is a 22 y.o. female who presents today as a new patient to establish care.  HPI   Pt was admitted to [facility] 7/25-7/26/24 for intractable N/V. History of regular mariuana use, in ED potassium was 2.6, given IV potassium protonix,antiemetics and fluids.  She was advised to dc marijuana, continue of protonix and prn zofran.  Thought to be cannabinoid hyperemesis syndrome.  In ED hbg was 9.7    Past Medical History:  Diagnosis Date   Constipation    No past surgical history on file. Family Status  Relation Name Status   Mother  Alive   Sister  Alive   MGM  Alive   MGF  Alive  No partnership data on file   Family History  Problem Relation Age of Onset   Asthma Sister    Social History   Socioeconomic History   Marital status: Single    Spouse name: Not on file   Number of children: Not on file   Years of education: Not on file   Highest education level: Not on file  Occupational History   Not on file  Tobacco Use   Smoking status: Never    Passive exposure: Yes   Smokeless tobacco: Never   Tobacco comments:    family smokes inside  Vaping Use   Vaping status: Every Day  Substance and Sexual Activity   Alcohol use: Yes   Drug use: Yes    Types: Marijuana    Comment: daily   Sexual activity: Not on file  Other Topics Concern   Not on file  Social History Narrative   Live with Maternal grandma and her sister    She will start 11th grade in the fall at [employer].    She enjoys working out, watching [employer], and doing her make-up.    Social Determinants of Health   Financial Resource Strain: Not on file  Food Insecurity: No Food Insecurity (06/22/2023)   Hunger Vital Sign    Worried  About Running Out of Food in the Last Year: Never true    Ran Out of Food in the Last Year: Never true  Transportation Needs: No Transportation Needs (06/22/2023)   PRAPARE - Administrator, Civil Service (Medical): No    Lack of Transportation (Non-Medical): No  Physical Activity: Not on file  Stress: Not on file  Social Connections: Not on file   Outpatient Medications Prior to Visit  Medication Sig   DENTA 5000 PLUS 1.1 % CREA dental cream BRUSH EVERY OTHER NIGHT AND NOTHING TO EAT OR DRINK FOR 30 MINUTES   desogestrel-ethinyl estradiol (APRI) 0.15-30 MG-MCG tablet Take by mouth.   ondansetron (ZOFRAN) 4 MG tablet Take 1 tablet (4 mg total) by mouth every 8 (eight) hours as needed for nausea or vomiting.   pantoprazole (PROTONIX) 40 MG tablet Take 1 tablet (40 mg total) by mouth 2 (two) times daily before a meal for 15 days.   Vitamin D, Ergocalciferol, (DRISDOL) 1.25 MG (50000 UT) CAPS capsule TAKE 1 CAP BY MOUTH WEEKLY FOR 12 WEEKS   No facility-administered medications prior to visit.   No Known Allergies  Immunization History  Administered Date(s) Administered   PFIZER(Purple  Top)SARS-COV-2 Vaccination 04/09/2020, 04/30/2020    Health Maintenance  Topic Date Due   HPV VACCINES (1 - 3-dose series) Never done   Hepatitis C Screening  Never done   DTaP/Tdap/Td (1 - Tdap) Never done   COVID-19 Vaccine (3 - 2023-24 season) 07/28/2022   INFLUENZA VACCINE  06/28/2023   HIV Screening  Completed    Patient Care Team: Patient, No Pcp Per as PCP - General (General Practice)  Review of Systems    Objective    LMP 06/19/2023 (Exact Date)   Physical Exam ***  Depression Screen    05/15/2019    8:53 AM  PHQ 2/9 Scores  PHQ - 2 Score 0   No results found for any visits on 07/03/23.  Assessment & Plan     ***  No follow-ups on file.     I, Alfredia Ferguson, PA-C have reviewed all documentation for this visit. The documentation on  07/03/23   for the exam,  diagnosis, procedures, and orders are all accurate and complete.    Alfredia Ferguson, PA-C  [facility] at [address] [phone] (phone) [phone] (fax)  [facility]

## 2023-07-03 NOTE — Telephone Encounter (Signed)
Pt called stating that she had accidentally went to the [facility] located at [facility] instead of coming to our office and had missed her NP appt. Pt was looking to reschedule and advised that a message would be sent back to get approval from [place]. Pt acknowledged understanding.

## 2023-07-04 NOTE — Progress Notes (Deleted)
New patient visit   Patient: Ashley Castro   DOB: 13-Jul-2001   21 y.o. Female  MRN: [id number] Visit Date: 07/05/2023  Today's healthcare provider: Alfredia Ferguson, PA-C   No chief complaint on file.  Subjective    Ashley Castro is a 22 y.o. female who presents today as a new patient to establish care.  HPI  ***  Past Medical History:  Diagnosis Date   Constipation    No past surgical history on file. Family Status  Relation Name Status   Mother  Alive   Sister  Alive   MGM  Alive   MGF  Alive  No partnership data on file   Family History  Problem Relation Age of Onset   Asthma Sister    Social History   Socioeconomic History   Marital status: Single    Spouse name: Not on file   Number of children: Not on file   Years of education: Not on file   Highest education level: Not on file  Occupational History   Not on file  Tobacco Use   Smoking status: Never    Passive exposure: Yes   Smokeless tobacco: Never   Tobacco comments:    family smokes inside  Vaping Use   Vaping status: Every Day  Substance and Sexual Activity   Alcohol use: Yes   Drug use: Yes    Types: Marijuana    Comment: daily   Sexual activity: Not on file  Other Topics Concern   Not on file  Social History Narrative   Live with Maternal grandma and her sister    She will start 11th grade in the fall at [employer].    She enjoys working out, watching [employer], and doing her make-up.    Social Determinants of Health   Financial Resource Strain: Not on file  Food Insecurity: No Food Insecurity (06/22/2023)   Hunger Vital Sign    Worried About Running Out of Food in the Last Year: Never true    Ran Out of Food in the Last Year: Never true  Transportation Needs: No Transportation Needs (06/22/2023)   PRAPARE - Administrator, Civil Service (Medical): No    Lack of Transportation (Non-Medical): No  Physical Activity: Not on file   Stress: Not on file  Social Connections: Not on file   Outpatient Medications Prior to Visit  Medication Sig   DENTA 5000 PLUS 1.1 % CREA dental cream BRUSH EVERY OTHER NIGHT AND NOTHING TO EAT OR DRINK FOR 30 MINUTES   desogestrel-ethinyl estradiol (APRI) 0.15-30 MG-MCG tablet Take by mouth.   ondansetron (ZOFRAN) 4 MG tablet Take 1 tablet (4 mg total) by mouth every 8 (eight) hours as needed for nausea or vomiting.   pantoprazole (PROTONIX) 40 MG tablet Take 1 tablet (40 mg total) by mouth 2 (two) times daily before a meal for 15 days.   Vitamin D, Ergocalciferol, (DRISDOL) 1.25 MG (50000 UT) CAPS capsule TAKE 1 CAP BY MOUTH WEEKLY FOR 12 WEEKS   No facility-administered medications prior to visit.   No Known Allergies  Immunization History  Administered Date(s) Administered   PFIZER(Purple Top)SARS-COV-2 Vaccination 04/09/2020, 04/30/2020    Health Maintenance  Topic Date Due   HPV VACCINES (1 - 3-dose series) Never done   Hepatitis C Screening  Never done   DTaP/Tdap/Td (1 - Tdap) Never done   COVID-19 Vaccine (3 - 2023-24 season) 07/28/2022   INFLUENZA VACCINE  06/28/2023   HIV Screening  Completed    Patient Care Team: Patient, No Pcp Per as PCP - General (General Practice)  Review of Systems    Objective    LMP 06/19/2023 (Exact Date)   Physical Exam ***  Depression Screen    05/15/2019    8:53 AM  PHQ 2/9 Scores  PHQ - 2 Score 0   No results found for any visits on 07/05/23.  Assessment & Plan     ***  No follow-ups on file.     {provider attestation***:1}   Alfredia Ferguson, PA-C  [facility] [facility] [phone] (phone) [phone] (fax)  [facility]

## 2023-07-05 ENCOUNTER — Ambulatory Visit: Payer: Medicaid Other | Admitting: Physician Assistant

## 2024-04-03 ENCOUNTER — Emergency Department (HOSPITAL_BASED_OUTPATIENT_CLINIC_OR_DEPARTMENT_OTHER)
Admission: EM | Admit: 2024-04-03 | Discharge: 2024-04-03 | Disposition: A | Discharge status: Home / Self Care | Attending: Emergency Medicine | Admitting: Emergency Medicine

## 2024-04-03 ENCOUNTER — Encounter (HOSPITAL_BASED_OUTPATIENT_CLINIC_OR_DEPARTMENT_OTHER): Payer: Self-pay | Admitting: Emergency Medicine

## 2024-04-03 ENCOUNTER — Other Ambulatory Visit: Payer: Self-pay

## 2024-04-03 DIAGNOSIS — R1013 Epigastric pain: Secondary | ICD-10-CM | POA: Insufficient documentation

## 2024-04-03 DIAGNOSIS — Z79899 Other long term (current) drug therapy: Secondary | ICD-10-CM | POA: Diagnosis not present

## 2024-04-03 DIAGNOSIS — R112 Nausea with vomiting, unspecified: Secondary | ICD-10-CM | POA: Diagnosis not present

## 2024-04-03 DIAGNOSIS — E875 Hyperkalemia: Secondary | ICD-10-CM | POA: Diagnosis not present

## 2024-04-03 DIAGNOSIS — R630 Anorexia: Secondary | ICD-10-CM | POA: Diagnosis not present

## 2024-04-03 DIAGNOSIS — E876 Hypokalemia: Secondary | ICD-10-CM | POA: Insufficient documentation

## 2024-04-03 DIAGNOSIS — R0602 Shortness of breath: Secondary | ICD-10-CM | POA: Insufficient documentation

## 2024-04-03 DIAGNOSIS — F419 Anxiety disorder, unspecified: Secondary | ICD-10-CM | POA: Insufficient documentation

## 2024-04-03 LAB — URINALYSIS, ROUTINE W REFLEX MICROSCOPIC
Bilirubin Urine: NEGATIVE
Glucose, UA: NEGATIVE mg/dL
Ketones, ur: >=80 mg/dL — AB
Leukocytes,Ua: NEGATIVE
Nitrite: NEGATIVE
Protein, ur: NEGATIVE mg/dL
Specific Gravity, Urine: 1.02 (ref 1.005–1.030)
pH: 6 (ref 5.0–8.0)

## 2024-04-03 LAB — URINALYSIS, MICROSCOPIC (REFLEX)

## 2024-04-03 LAB — BASIC METABOLIC PANEL WITH GFR
Anion gap: 13 (ref 5–15)
BUN: 14 mg/dL (ref 6–20)
CO2: 18 mmol/L — ABNORMAL LOW (ref 22–32)
Calcium: 8.1 mg/dL — ABNORMAL LOW (ref 8.9–10.3)
Chloride: 103 mmol/L (ref 98–111)
Creatinine, Ser: 0.72 mg/dL (ref 0.44–1.00)
GFR, Estimated: >60 mL/min (ref >60)
Glucose, Bld: 227 mg/dL — ABNORMAL HIGH (ref 70–99)
Potassium: 3.3 mmol/L — ABNORMAL LOW (ref 3.5–5.1)
Sodium: 133 mmol/L — ABNORMAL LOW (ref 135–145)

## 2024-04-03 LAB — COMPREHENSIVE METABOLIC PANEL WITH GFR
ALT: 9 U/L (ref 0–44)
AST: 18 U/L (ref 15–41)
Albumin: 4.5 g/dL (ref 3.5–5.0)
Alkaline Phosphatase: 64 U/L (ref 38–126)
Anion gap: 19 — ABNORMAL HIGH (ref 5–15)
BUN: 18 mg/dL (ref 6–20)
CO2: 15 mmol/L — ABNORMAL LOW (ref 22–32)
Calcium: 9.2 mg/dL (ref 8.9–10.3)
Chloride: 103 mmol/L (ref 98–111)
Creatinine, Ser: 0.79 mg/dL (ref 0.44–1.00)
GFR, Estimated: >60 mL/min (ref >60)
Glucose, Bld: 100 mg/dL — ABNORMAL HIGH (ref 70–99)
Potassium: 3.2 mmol/L — ABNORMAL LOW (ref 3.5–5.1)
Sodium: 136 mmol/L (ref 135–145)
Total Bilirubin: 1.1 mg/dL (ref 0.0–1.2)
Total Protein: 7.6 g/dL (ref 6.5–8.1)

## 2024-04-03 LAB — PREGNANCY, URINE: Preg Test, Ur: NEGATIVE

## 2024-04-03 LAB — URINE DRUG SCREEN
Amphetamines: NOT DETECTED
Barbiturates: NOT DETECTED
Benzodiazepines: NOT DETECTED
Cocaine: NOT DETECTED
Fentanyl: NOT DETECTED
Methadone Scn, Ur: NOT DETECTED
Opiates: NOT DETECTED
Tetrahydrocannabinol: DETECTED — AB

## 2024-04-03 LAB — HEMOGLOBIN A1C
Hgb A1c MFr Bld: 5 % (ref 4.8–5.6)
Mean Plasma Glucose: 96.8 mg/dL

## 2024-04-03 LAB — CBC
HCT: 35.2 % — ABNORMAL LOW (ref 36.0–46.0)
Hemoglobin: 11.2 g/dL — ABNORMAL LOW (ref 12.0–15.0)
MCH: 23.9 pg — ABNORMAL LOW (ref 26.0–34.0)
MCHC: 31.8 g/dL (ref 30.0–36.0)
MCV: 75.2 fL — ABNORMAL LOW (ref 80.0–100.0)
Platelets: 222 10*3/uL (ref 150–400)
RBC: 4.68 MIL/uL (ref 3.87–5.11)
RDW: 18.1 % — ABNORMAL HIGH (ref 11.5–15.5)
WBC: 6.1 10*3/uL (ref 4.0–10.5)
nRBC: 0 % (ref 0.0–0.2)

## 2024-04-03 LAB — LIPASE, BLOOD: Lipase: 10 U/L — ABNORMAL LOW (ref 11–51)

## 2024-04-03 MED ORDER — DROPERIDOL 2.5 MG/ML IJ SOLN
1.2500 mg | Freq: Once | INTRAMUSCULAR | Status: AC
  Administered 2024-04-03: 1.25 mg via INTRAVENOUS
  Filled 2024-04-03: qty 2

## 2024-04-03 MED ORDER — POTASSIUM CHLORIDE CRYS ER 20 MEQ PO TBCR
40.0000 meq | EXTENDED_RELEASE_TABLET | Freq: Once | ORAL | Status: AC
  Administered 2024-04-03: 40 meq via ORAL
  Filled 2024-04-03: qty 2

## 2024-04-03 MED ORDER — ONDANSETRON HCL 4 MG PO TABS: 4.0000 mg | ORAL_TABLET | Freq: Three times a day (TID) | ORAL | 0 refills | Status: AC | PRN

## 2024-04-03 MED ORDER — DEXTROSE 5 % IV BOLUS
500.0000 mL | Freq: Once | INTRAVENOUS | Status: AC
  Administered 2024-04-03: 500 mL via INTRAVENOUS

## 2024-04-03 MED ORDER — POTASSIUM CHLORIDE CRYS ER 20 MEQ PO TBCR: 20.0000 meq | EXTENDED_RELEASE_TABLET | Freq: Every day | ORAL | 0 refills | Status: AC

## 2024-04-03 MED ORDER — SODIUM CHLORIDE 0.9 % IV BOLUS
1000.0000 mL | Freq: Once | INTRAVENOUS | Status: AC
  Administered 2024-04-03: 1000 mL via INTRAVENOUS

## 2024-04-03 MED ORDER — DIAZEPAM 5 MG/ML IJ SOLN
2.5000 mg | Freq: Once | INTRAMUSCULAR | Status: AC
  Administered 2024-04-03: 2.5 mg via INTRAVENOUS
  Filled 2024-04-03: qty 2

## 2024-04-03 MED ORDER — FAMOTIDINE 20 MG PO TABS: 20.0000 mg | ORAL_TABLET | Freq: Two times a day (BID) | ORAL | 1 refills | Status: AC

## 2024-04-03 NOTE — ED Triage Notes (Signed)
 Emesis x 4 days , Hx GERD . Omeprazole is not helping . Lethargy , chest pain , shortness of breath , poor appetite . Near syncope she said .

## 2024-04-03 NOTE — Discharge Instructions (Signed)
 Contact a health care provider if: Your symptoms get worse. You cannot drink fluids without vomiting or severe pain. You have pain and trouble swallowing after an episode. Get help right away if: You cannot stop vomiting. You have blood in your vomit or your vomit looks like coffee grounds. You have severe abdominal pain. You have stools that are bloody or black, or stools that look like tar. You have symptoms of dehydration, such as: Sunken eyes. Inability to make tears. Cracked lips or dry mouth. Decreased urine production. Weakness. Sleepiness. Dizziness, light-headedness, or fainting. These symptoms may be an emergency. Get help right away. Call 911. Do not wait to see if the symptoms will go away. Do not drive yourself to the hospital.

## 2024-04-03 NOTE — ED Provider Notes (Signed)
 [facility] Provider Note   CSN: [id number] Arrival date & time: 04/03/24  0825     History  Chief Complaint  Patient presents with   Emesis    Ashley Castro is a 23 y.o. female who presents emergency department with a chief complaint of epigastric abdominal pain burning pain behind her chest associated with abdominal pain which is diffuse and nausea and vomiting.  Patient has been having intermittent episodes of the same for the past 2 to 3 years.  She has had multiple ER visits in the past and was told previously that she might have cannabis hyperemesis.  Patient's grandmother is here with her and states that she has had significant weight loss over the past year and shows me a picture on her phone of her about a year ago taking 10 to 15 pounds heavier.  Mother is very worried that she could have some underlying cancer.  Unsure if she has had scan in the past however review of EMR shows 2 CT scans at [facility] last year and 1 2 years ago all of which are essentially normal.  The patient reports that she she has poor appetite and anxiety but has no abdominal pain regularly and these seems to come in waves of severity.  He has not seen a GI specialist.  She has not had an EGD.  She is not on any chronic medications.  He does report smoking marijuana but states that she does not "smoke that much."   Emesis      Home Medications Prior to Admission medications   Medication Sig Start Date End Date Taking? Authorizing Provider  DENTA 5000 PLUS 1.1 % CREA dental cream BRUSH EVERY OTHER NIGHT AND NOTHING TO EAT OR DRINK FOR 30 MINUTES 04/14/19   [provider]  desogestrel-ethinyl estradiol (APRI) 0.15-30 MG-MCG tablet Take by mouth. 07/23/19   [provider]  ondansetron  (ZOFRAN ) 4 MG tablet Take 1 tablet (4 mg total) by mouth every 8 (eight) hours as needed for nausea or vomiting. 06/22/23   Audria Leather, MD  pantoprazole   (PROTONIX ) 40 MG tablet Take 1 tablet (40 mg total) by mouth 2 (two) times daily before a meal for 15 days. 06/22/23 07/07/23  Audria Leather, MD  Vitamin D, Ergocalciferol, (DRISDOL) 1.25 MG (50000 UT) CAPS capsule TAKE 1 CAP BY MOUTH WEEKLY FOR 12 WEEKS 04/22/19   [provider]      Allergies    Patient has no known allergies.    Review of Systems   Review of Systems  Gastrointestinal:  Positive for vomiting.    Physical Exam Updated Vital Signs BP 129/73 (BP Location: Right Arm)   Pulse 81   Temp 97.8 F (36.6 C) (Oral)   Resp 18   Wt 53.5 kg   LMP 03/31/2024   SpO2 94%   BMI 22.30 kg/m  Physical Exam Vitals and nursing note reviewed.  Constitutional:      General: She is not in acute distress.    Appearance: She is well-developed. She is not diaphoretic.     Comments: Patient appears uncomfortable.  She is writhing around in the bed and seems to be unable to find a position of comfort.  HENT:     Head: Normocephalic and atraumatic.     Right Ear: External ear normal.     Left Ear: External ear normal.     Nose: Nose normal.     Mouth/Throat:  Mouth: Mucous membranes are moist.  Eyes:     General: No scleral icterus.    Conjunctiva/sclera: Conjunctivae normal.  Cardiovascular:     Rate and Rhythm: Normal rate and regular rhythm.     Heart sounds: Normal heart sounds. No murmur heard.    No friction rub. No gallop.  Pulmonary:     Effort: Pulmonary effort is normal. No respiratory distress.     Breath sounds: Normal breath sounds.  Abdominal:     General: Bowel sounds are normal. There is no distension.     Palpations: Abdomen is soft. There is no mass.     Tenderness: There is abdominal tenderness. There is no guarding.  Musculoskeletal:     Cervical back: Normal range of motion.  Skin:    General: Skin is warm and dry.  Neurological:     Mental Status: She is alert and oriented to person, place, and time.  Psychiatric:        Mood and Affect:  Mood is anxious.        Behavior: Behavior normal.     ED Results / Procedures / Treatments   Labs (all labs ordered are listed, but only abnormal results are displayed) Labs Reviewed  LIPASE, BLOOD - Abnormal; Notable for the following components:      Result Value   Lipase 10 (*)    All other components within normal limits  COMPREHENSIVE METABOLIC PANEL WITH GFR - Abnormal; Notable for the following components:   Potassium 3.2 (*)    CO2 15 (*)    Glucose, Bld 100 (*)    Anion gap 19 (*)    All other components within normal limits  CBC - Abnormal; Notable for the following components:   Hemoglobin 11.2 (*)    HCT 35.2 (*)    MCV 75.2 (*)    MCH 23.9 (*)    RDW 18.1 (*)    All other components within normal limits  URINALYSIS, ROUTINE W REFLEX MICROSCOPIC  PREGNANCY, URINE  URINE DRUG SCREEN    EKG None  Radiology No results found.  Procedures Procedures    Medications Ordered in ED Medications  droperidol  (INAPSINE ) 2.5 MG/ML injection 1.25 mg (1.25 mg Intravenous Given 04/03/24 0914)  diazepam  (VALIUM ) injection 2.5 mg (2.5 mg Intravenous Given 04/03/24 0913)  sodium chloride  0.9 % bolus 1,000 mL (1,000 mLs Intravenous New Bag/Given 04/03/24 0914)    ED Course/ Medical Decision Making/ A&P Clinical Course as of 04/05/24 1906  Thu Apr 03, 2024  1144 Tetrahydrocannabinol(!): DETECTED [AH]  1144 Urinalysis, Routine w reflex microscopic -Urine, Clean Catch(!) [AH]  1144 Comprehensive metabolic panel(!) [AH]  1144 Lipase, blood(!) [AH]  1145 CBC(!) [AH]  1145 Potassium(!): 3.2 Given oral meds [AH]  1145 Anion gap(!): 19 [AH]  1145 CO2(!): 15 [AH]  1158 Since labs reviewed.  She has positive THC in her UDS.  This would definitely be consistent with history of cannabis hyperemesis syndrome.  [AH]  1158 Review of CMP shows anion gap of 19 CO2 level of 15 and glucose of 100.  Her urine also has ketones.  I believe this is all secondary to volume loss and vomiting  and does not represent early DKA.  Currently the plan is to give another bolus of D5 and recheck a BMP [AH]  1340 Anion gap: 13 [AH]  1340 Glucose(!): 227 Anion gap cleared, blood sugar 227 after IV D5   [AH]    Clinical Course User Index [AH] Toddy Boyd, PA-C  Medical Decision Making Patient here with recurrent episode of Nausea and vomiting. The emergent differential diagnosis for vomiting includes, but is not limited to ACS/MI, DKA, Ischemic bowel, Meningitis, Sepsis, Acute gastric dilation, Adrenal insufficiency, Appendicitis,  Bowel obstruction/ileus, Carbon monoxide poisoning, Cholecystitis, Electrolyte abnormalities, Elevated ICP, Gastric outlet obstruction, Pancreatitis, Ruptured viscus, Biliary colic, Cannabinoid hyperemesis syndrome, Gastritis, Gastroenteritis, Gastroparesis,  Narcotic withdrawal, Peptic ulcer disease, and UTI  After review of all data points sxs are most consistent with Cannabinoid hyperemesis syndrome.  Sxs resolved almost immediately with droperidol  and fluids Mild hyperkalemia corrected with oral repletion.  Pts grandmother concerned due to patient sig weight loss. Will give GI referral. Patient advised to dc marijuana in order to rule that out as the underlying cause.   Dc with Allergies as of 04/03/2024   No Known Allergies     ED DC Medication List :  famotidine  20 MG tablet  ondansetron  4 MG tablet potassium 20 mEq  Discussed return precautions. Patient tolerating po and ready for dc         Amount and/or Complexity of Data Reviewed Independent Historian: guardian    Details: Patient's grandmother External Data Reviewed: radiology.    Details: 3 Ct scans in the last year and 1/2 all  show only benign findings Labs: ordered. Decision-making details documented in ED Course. ECG/medicine tests: ordered and independent interpretation performed.    Details: As per ED course  Risk Prescription drug  management.           Final Clinical Impression(s) / ED Diagnoses Final diagnoses:  None    Rx / DC Orders ED Discharge Orders     None         Tama Fails, PA-C 04/05/24 1920    Lowery Rue, DO 04/08/24 2339
